# Patient Record
Sex: Male | Born: 2005 | Race: Black or African American | Hispanic: No | Marital: Single | Smoking: Current some day smoker
Health system: Southern US, Community
[De-identification: ages and names within clinical notes are randomized; demographics above are authoritative.]

## PROBLEM LIST (undated history)

## (undated) DIAGNOSIS — E119 Type 2 diabetes mellitus without complications: Secondary | ICD-10-CM

## (undated) HISTORY — PX: TONSILLECTOMY: SUR1361

## (undated) HISTORY — PX: ADENOIDECTOMY: SUR15

---
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
## 2024-01-05 ENCOUNTER — Encounter (INDEPENDENT_AMBULATORY_CARE_PROVIDER_SITE_OTHER): Payer: Self-pay

## 2024-01-31 ENCOUNTER — Encounter (INDEPENDENT_AMBULATORY_CARE_PROVIDER_SITE_OTHER): Payer: Self-pay

## 2024-02-06 ENCOUNTER — Other Ambulatory Visit (INDEPENDENT_AMBULATORY_CARE_PROVIDER_SITE_OTHER): Payer: Self-pay | Admitting: Family

## 2024-02-06 DIAGNOSIS — E1065 Type 1 diabetes mellitus with hyperglycemia: Secondary | ICD-10-CM

## 2024-02-06 DIAGNOSIS — R739 Hyperglycemia, unspecified: Secondary | ICD-10-CM

## 2024-02-19 ENCOUNTER — Ambulatory Visit (INDEPENDENT_AMBULATORY_CARE_PROVIDER_SITE_OTHER): Payer: Self-pay | Admitting: Family

## 2024-02-19 NOTE — Progress Notes (Deleted)
 Pediatric Endocrinology Diabetes Consultation Follow-up Visit  Jeffrey Bowers 2006/01/16 [id number]  Chief Complaint: Follow-up type 1 diabetes   Roxane Copp, MD   HPI: Jeffrey Bowers  is a 18 y.o. male presenting for follow-up of type 1 diabetes. he is accompanied to this visit by his mother.  1. Jeffrey Bowers was taken to [facility] ED on 07/17/2017 with polyuria, polydispsia and weight loss. He had been vomiting that day and could not keep down fluids, he also had labored breathing. His CBG was >600 and BHOB was >8.0, he was admitted to the PICU. Once moved to the floor he was started on Lantus  long acting insulin  and Novolog  short acting. He was provided diabetes education before being discharged home on 07/21/2017  2. Jeffrey Bowers was last seen in clinic on 11/2023, since that time he has been doing well.   He is busy with college at [employer], he is doing well living in the dorms. Starts a new job at [employer] soon. Gets activity skateboarding and walking around campus.   Sertraline dose was increased to 100 mg, he feels like medication has helped with anxiety and depression. He is also in therapy once monthly.   Reports that diabetes is going "better". He is wearing CGM more consistently since last visit and also is making sure to change insulin  in his pump before he runs out. He normally boluses before eating, rarely forgets to bolus. Carb intake averages between 50-75 grams per meal. Hypoglycemia does not occur often, only if " I over bolus". He is able to feel symptoms when he is under 80, get light headed and weak.   Concerns  - His insulin  pump is out of warranty. We are unable to download pump, software says it is out of warranty when we try to download.  -   Insulin  regimen: Tandem Tslim insulin  pump Basal Rates 12AM 1.30   4am 1.15   6am 1.15  8am 1.35  9pm 1.35     31.4 units per day   Insulin  to Carbohydrate Ratio 12AM 10   4am 15  6am 8am 8 6  9pm 6        Insulin  Sensitivity  Factor 12AM 50   8am 35  9pm 40            Target Blood Glucose 12AM 150  6am 120  9pm 150           Hypoglycemia: Able to feel low blood sugars.  No glucagon  needed recently. Rare  Insulin  pump and CGM level:  Unable to download pump information today.   Med-alert ID: Not currently wearing. Injection sites: abdomen, arms and legs  Annual labs due: 08/2023  Ophthalmology due: he has not been recently, is overdue. Discussed importance today of annual eye exams.     3. ROS: Greater than 10 systems reviewed with pertinent positives listed in HPI, otherwise neg. All systems reviewed with pertinent positives listed below; otherwise negative. Constitutional: Sleeping well HEENT: No vision changes. No difficulty swallowing.  Respiratory: No increased work of breathing currently GI: No constipation or diarrhea Musculoskeletal: No joint deformity Neuro: Normal affect Endocrine: As above Psych: denies depression and anxiety currently, taking sertraline 100 mg daily   Past Medical History:   Past Medical History:  Diagnosis Date   Diabetes mellitus without complication (HCC)     Medications:  Outpatient Encounter Medications as of 02/19/2024  Medication Sig   Accu-Chek Softclix Lancets lancets Use to check blood sugar 8 times a day  albuterol (PROVENTIL HFA;VENTOLIN HFA) 108 (90 BASE) MCG/ACT inhaler Inhale 2 puffs into the lungs every 6 (six) hours as needed for wheezing or shortness of breath. (Patient not taking: Reported on 12/09/2023)   Blood Glucose Monitoring Suppl (ACCU-CHEK GUIDE) w/Device KIT Use meter to check blood sugar 8 times a day (Patient not taking: Reported on 12/09/2023)   cetirizine (ZYRTEC) 10 MG tablet Take 10 mg by mouth daily as needed for allergies. (Patient not taking: Reported on 12/09/2023)   Continuous Glucose Sensor (DEXCOM G6 SENSOR) MISC USE AS DIRECTED CHANGE  SENSOR  EVERY  10  DAYS   Continuous Glucose Transmitter (DEXCOM G6 TRANSMITTER) MISC  USE AS DIRECTED AND  CHANGE  EVERY  90  DAYS   FLUoxetine (PROZAC) 10 MG capsule Take 10 mg by mouth daily. (Patient not taking: Reported on 12/09/2023)   fluticasone (FLONASE) 50 MCG/ACT nasal spray Place 2 sprays into both nostrils daily as needed for allergies or rhinitis. (Patient not taking: Reported on 12/09/2023)   Glucagon  (BAQSIMI  TWO PACK) 3 MG/DOSE POWD Place 1 kit into the nose as needed. (Patient not taking: Reported on 12/09/2023)   glucose blood (ACCU-CHEK GUIDE) test strip Test sugars 6 times daily   HYDROcodone -acetaminophen  (HYCET) 7.5-325 mg/15 ml solution Take 10 mLs by mouth 4 (four) times daily as needed for moderate pain. (Patient not taking: Reported on 12/09/2023)   insulin  aspart (NOVOLOG  FLEXPEN) 100 UNIT/ML FlexPen INJECT UP TO 50 UNITS INTO THE SKIN DAILY.   insulin  glargine (LANTUS  SOLOSTAR) 100 UNIT/ML Solostar Pen Inject Up to 50 units/day.   NOVOLOG  100 UNIT/ML injection INJECT 300 UNITS IN INSULIN  PUMP EVERY 48 HOURS   sertraline (ZOLOFT) 25 MG tablet Take 25 mg by mouth daily.   sucralfate  (CARAFATE ) 1 GM/10ML suspension Take 5 mLs (0.5 g total) by mouth 4 (four) times daily as needed. (Patient not taking: Reported on 12/09/2023)   No facility-administered encounter medications on file as of 02/19/2024.    Allergies: Allergies  Allergen Reactions   Chocolate     Throat issues    Surgical History: Past Surgical History:  Procedure Laterality Date   ADENOIDECTOMY     TONSILLECTOMY      Family History:  Family History  Problem Relation Age of Onset   Diabetes Maternal Grandmother    Diabetes Paternal Grandmother       Social History: Lives with: Mother, father and 3 siblings.   Physical Exam:  There were no vitals filed for this visit.      There were no vitals taken for this visit. Body mass index: body mass index is unknown because there is no height or weight on file. Blood pressure %iles are not available for patients who are 18 years or  older.  Ht Readings from Last 3 Encounters:  02/12/23 5' 9.8" (1.773 m) (57%, Z= 0.17)*  11/13/22 5' 9.49" (1.765 m) (53%, Z= 0.08)*  05/08/22 5' 9.49" (1.765 m) (56%, Z= 0.15)*   * Growth percentiles are based on CDC (Boys, 2-20 Years) data.   Wt Readings from Last 3 Encounters:  12/09/23 150 lb 12.8 oz (68.4 kg) (49%, Z= -0.02)*  08/18/23 152 lb 9.6 oz (69.2 kg) (54%, Z= 0.10)*  05/15/23 140 lb 9.6 oz (63.8 kg) (36%, Z= -0.36)*   * Growth percentiles are based on CDC (Boys, 2-20 Years) data.    Physical Exam.  General: Well developed, well nourished male in no acute distress.  Head: Normocephalic, atraumatic.   Eyes:  Pupils equal and round.  EOMI.  Sclera white.  No eye drainage.   Ears/Nose/Mouth/Throat: Nares patent, no nasal drainage.  Normal dentition, mucous membranes moist.  Neck: supple, no cervical lymphadenopathy, no thyromegaly Cardiovascular: regular rate, normal S1/S2, no murmurs Respiratory: No increased work of breathing.  Lungs clear to auscultation bilaterally.  No wheezes. Abdomen: soft, nontender, nondistended. Normal bowel sounds.  No appreciable masses  Extremities: warm, well perfused, cap refill < 2 sec.   Musculoskeletal: Normal muscle mass.  Normal strength Skin: warm, dry.  No rash or lesions. Neurologic: alert and oriented, normal speech, no tremor   Labs:  Results for orders placed or performed in visit on 12/09/23  POCT Glucose (Device for Home Use)   Collection Time: 12/09/23  1:23 PM  Result Value Ref Range   Glucose Fasting, POC     POC Glucose 160 (A) 70 - 99 mg/dl  POCT glycosylated hemoglobin (Hb A1C)   Collection Time: 12/09/23  1:27 PM  Result Value Ref Range   Hemoglobin A1C 7.8 (A) 4.0 - 5.6 %   HbA1c POC (<> result, manual entry)     HbA1c, POC (prediabetic range)     HbA1c, POC (controlled diabetic range)         Assessment/Plan: Jeffrey Bowers is a 18 y.o. male with  type 1 diabetes on Tandem insulin  pump and Dexcom CGM. Unable  to download and review insulin  pump today due to out of warranty, Jeffrey Bowers is in process of ordering new pump. Hemoglobin A1c has improved to 7.8% but is higher then ADA goal of <7%.    1. Type 1 diabetes mellitus in pediatric patient (HCC)/ 2. Hyperglycemia/ - Reviewed insulin  pump and CGM download. Discussed trends and patterns.  - Rotate pump sites to prevent scar tissue.  - bolus 15 minutes prior to eating to limit blood sugar spikes.  - Reviewed carb counting and importance of accurate carb counting.  - Discussed signs and symptoms of hypoglycemia. Always have glucose available.  - POCT glucose and hemoglobin A1c  - Reviewed growth chart.    3. Insulin  pump titration  - Insulin  pump in place.   Follow-up:   3 month.   LOS:  37 minutes  spent today reviewing the medical chart, counseling the patient/family, and documenting today's visit. This time does not include CGM interpretation.   When a patient is on insulin , intensive monitoring of blood glucose levels is necessary to avoid hyperglycemia and hypoglycemia. Severe hyperglycemia/hypoglycemia can lead to hospital admissions and be life threatening.    Candee Cha, DNP, FNP-C  Pediatric Specialist  [address]  [place], [address]  Tele: [phone]

## 2024-03-03 ENCOUNTER — Ambulatory Visit (INDEPENDENT_AMBULATORY_CARE_PROVIDER_SITE_OTHER): Payer: Self-pay | Admitting: Family

## 2024-03-29 ENCOUNTER — Other Ambulatory Visit (HOSPITAL_COMMUNITY): Payer: Self-pay

## 2024-03-29 ENCOUNTER — Telehealth (INDEPENDENT_AMBULATORY_CARE_PROVIDER_SITE_OTHER): Payer: Self-pay | Admitting: Pharmacy Technician

## 2024-03-29 NOTE — Telephone Encounter (Signed)
 Pharmacy Patient Advocate Encounter   Received notification from Onbase that prior authorization for Dexcom G6 Sensor is required/requested.   Insurance verification completed.   The patient is insured through [facility] MEDICAID .   Key: [id number]  Prior Authorization form/request asks a question that requires your assistance. Please see the question below and advise accordingly. The PA will not be submitted until the necessary information is received.   **Patient needs appointment.**

## 2024-03-31 NOTE — Telephone Encounter (Signed)
 Mom called and spoke with me to begin with, Mom wasn't happy about not  being able to get his dexcoms. I asked kelly for assistance.

## 2024-03-31 NOTE — Telephone Encounter (Addendum)
 Spoke with mom, Patient last seen on 11/2023, adult endo referral sent 01/2024, adult endo appt is in Nov 2025.  Explained to mom that insurance requires that he been seen in the last 3 mo to get his Dexcom authorized.  She was not happy and said that she was going to sue everyone she has spoken with this as it is not acceptable.  I spoke with Dr. Arlana who has an opening 04/28/2024 at 10:45 am he is ok with seeing him.  I told mom that he will need to do finger sticks until that appt and enter them into his pump in manuel mode If using a pump.  Mom said that is not acceptable.  I explained that we do not have anything sooner.  She is welcome to call other adult endo's to see if they can get him in sooner and we can send another referral.  I again stated that he will need to do finger sticks, she was still upset and stated this is unacceptable.  Explained that insurance has their guidelines and they do not make exceptions.  We have no control or say so in insurance guidelines. She insisted that we give her a Dexcom G6 sample, I explained that we do not have those samples.  I apologized to mom that he was not seen before spenser left.  I also mentioned that he was scheduled on 6/5 and no showed. She then hung up.    Had front office schedule him with Dr. Arlana.

## 2024-04-28 ENCOUNTER — Ambulatory Visit (INDEPENDENT_AMBULATORY_CARE_PROVIDER_SITE_OTHER)

## 2024-04-28 ENCOUNTER — Encounter (INDEPENDENT_AMBULATORY_CARE_PROVIDER_SITE_OTHER): Payer: Self-pay

## 2024-04-28 VITALS — BP 110/70 | HR 80 | Ht 70.08 in | Wt 147.0 lb

## 2024-04-28 DIAGNOSIS — Z9641 Presence of insulin pump (external) (internal): Secondary | ICD-10-CM | POA: Diagnosis not present

## 2024-04-28 DIAGNOSIS — E1065 Type 1 diabetes mellitus with hyperglycemia: Secondary | ICD-10-CM | POA: Diagnosis not present

## 2024-04-28 DIAGNOSIS — R739 Hyperglycemia, unspecified: Secondary | ICD-10-CM

## 2024-04-28 LAB — POCT GLYCOSYLATED HEMOGLOBIN (HGB A1C): Hemoglobin A1C: 8.5 % — AB (ref 4.0–5.6)

## 2024-04-28 LAB — POCT GLUCOSE (DEVICE FOR HOME USE): Glucose Fasting, POC: 310 mg/dL — AB (ref 70–99)

## 2024-04-28 MED ORDER — NOVOLOG 100 UNIT/ML IJ SOLN: INTRAMUSCULAR | 2 refills | Status: AC

## 2024-04-28 MED ORDER — DEXCOM G6 TRANSMITTER MISC: 1 refills | Status: AC

## 2024-04-28 MED ORDER — DEXCOM G6 SENSOR MISC: 5 refills | Status: DC

## 2024-04-28 MED ORDER — BAQSIMI TWO PACK 3 MG/DOSE NA POWD: 1.0000 | NASAL | 1 refills | Status: AC | PRN

## 2024-04-28 MED ORDER — NOVOLOG FLEXPEN 100 UNIT/ML ~~LOC~~ SOPN: PEN_INJECTOR | SUBCUTANEOUS | 5 refills | Status: AC

## 2024-04-28 MED ORDER — LANTUS SOLOSTAR 100 UNIT/ML ~~LOC~~ SOPN: PEN_INJECTOR | SUBCUTANEOUS | 5 refills | Status: AC

## 2024-04-28 NOTE — Progress Notes (Signed)
 Pediatric Diabetes Follow Up Note  PATIENT:  Jeffrey Bowers Date of Examination: 04/28/2024 Date of Birth:  [birth year]   Referring Physician(s): Dale Fell, MD (but apparently will be establishing care with an adult provider)  Accompanied by:  Self  Dominica Kent reviewed my role as a temporary/substitute/pinch-hitting ([place] [facility]) Pediatric Endocrinologist.    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 at 5,874.6 U/mL (ref < 5) Presented in moderate-to-severe DKA   Last Visit:    12/09/23 with HbA1c :  7.8%    HbA1c Trend: DATE HbA1c (%) 8.5 INSULIN  REGIMEN/COMMENT  07/17/17 10.8 / 10.9 At [place] Begin basal-bolus by MDI after DKA resolved  11/11/17 5.9 MDI  02/03/18 6.6 MDI  05/07/18 7.2 MDI  08/18/18 8.5 Begin CSII  11/25/18 7.6 CSII  03/04/19 7.1 CSII  06/07/19 8.7 CSII  09/06/19 7.4 CSII  04/11/20 7.5 CSII  07/12/20 6.7 CSII  10/12/20 7.8 CSII  02/08/21 7.5 CSII  05/16/21 6.8 CSII  08/15/21 8.0 CSII  02/07/22 10.4 COVID CSII  05/08/22 10.7 CSII  11/13/22 9.4 CSII  02/12/23 8.1 CSII  05/15/23 7.5 CSII  08/18/23 8.4 CSII  12/09/23 7.8 CSII  04/28/24 8.5 CSII                  MDI = basal bolus by Multiple Daily Injections CSII = basal bolus by Continuous Subcutaneous Insulin  Infusion ("insulin  pump")  Insulin  Regimen:   Continuous subcutaneous insulin  infusion via a Tandem t:slim insulin  pump with the Control IQ algorithm currently delivering insulin  aspart (NovoLog ) at the following settings: BASAL   MN to 4 AM = 1.30 U/hr 4 AM to 6 AM = 1.15 U/hr 6 AM to 8 AM = 1.15 U/hr 8 AM to 9 AM 1.35 U/hr 9 AM to MN = 1.35 U/hr   .  BOLUS:  Meals and Snacks:  MN to 4 AM  = 1 unit  for every 10 grams of carbs 4 AM to 6 AM = 1 unit for every 15 grams of carbs 6 AM to 8 AM = 1 unit for every 8 grams of carbs 8 AM to 9 AM = 1 unit for every 6 grams of carbs 9 AM to MN = 1 unit for every 6 grams of carbs Correction Formula: Target Glucose = 110  mg/dL, as per Control IQ Insulin  Sensitivity Factor: MN to 4 AM = 50 4 AM to 6 AM = 45 6 AM to 8 AM = 45 8 AM to 9 AM = 35 9 AM to MN = 40  Active Insulin  Time = 5 hrs, per Control IQ  Uri Turnbough have a sneaking suspicion that some of the AM timeframes above were entered incorrectly and were supposed to have been PM.  In addition, he is using the Dexcom G6 continuous glucose monitor (CGM).    Additional Medications:    He reportedly has some basal insulin  glargine (Lantus ) at his mother's as backup long-acting insulin  in case of pump failure.  He needs some when he states that other places, including his girlfriends, Jamar Casagrande refilled that today. He did not believe he had any more glucagon  to treat serious lows.  He recalled that in the past he had a glucagon  kit but more recently had had the intranasal Baqsimi  glucagon .  Thanh Mottern refilled that today as well. He asked for refills of his CGM sensors etc.  Outpatient Encounter Medications as of 04/28/2024  Medication Sig   Accu-Chek Softclix Lancets lancets Use to check  blood sugar 8 times a day   Blood Glucose Monitoring Suppl (ACCU-CHEK GUIDE) w/Device KIT Use meter to check blood sugar 8 times a day   glucose blood (ACCU-CHEK GUIDE) test strip Test sugars 6 times daily   [DISCONTINUED] Continuous Glucose Sensor (DEXCOM G6 SENSOR) MISC USE AS DIRECTED CHANGE  SENSOR  EVERY  10  DAYS   [DISCONTINUED] Continuous Glucose Transmitter (DEXCOM G6 TRANSMITTER) MISC USE AS DIRECTED AND  CHANGE  EVERY  90  DAYS   [DISCONTINUED] FLUoxetine (PROZAC) 10 MG capsule Take 10 mg by mouth daily.   [DISCONTINUED] Glucagon  (BAQSIMI  TWO PACK) 3 MG/DOSE POWD Place 1 kit into the nose as needed.   [DISCONTINUED] HYDROcodone -acetaminophen  (HYCET) 7.5-325 mg/15 ml solution Take 10 mLs by mouth 4 (four) times daily as needed for moderate pain.   [DISCONTINUED] insulin  aspart (NOVOLOG  FLEXPEN) 100 UNIT/ML FlexPen INJECT UP TO 50 UNITS INTO THE SKIN DAILY.   [DISCONTINUED]  insulin  glargine (LANTUS  SOLOSTAR) 100 UNIT/ML Solostar Pen Inject Up to 50 units/day.   [DISCONTINUED] NOVOLOG  100 UNIT/ML injection INJECT 300 UNITS IN INSULIN  PUMP EVERY 48 HOURS   albuterol (PROVENTIL HFA;VENTOLIN HFA) 108 (90 BASE) MCG/ACT inhaler Inhale 2 puffs into the lungs every 6 (six) hours as needed for wheezing or shortness of breath. (Patient not taking: Reported on 04/28/2024)   cetirizine (ZYRTEC) 10 MG tablet Take 10 mg by mouth daily as needed for allergies. (Patient not taking: Reported on 04/28/2024)   Continuous Glucose Sensor (DEXCOM G6 SENSOR) MISC USE AS DIRECTED CHANGE  SENSOR  EVERY  10  DAYS   Continuous Glucose Transmitter (DEXCOM G6 TRANSMITTER) MISC USE AS DIRECTED AND  CHANGE  EVERY  90  DAYS   fluticasone (FLONASE) 50 MCG/ACT nasal spray Place 2 sprays into both nostrils daily as needed for allergies or rhinitis. (Patient not taking: Reported on 04/28/2024)   Glucagon  (BAQSIMI  TWO PACK) 3 MG/DOSE POWD Place 1 kit into the nose as needed.   insulin  aspart (NOVOLOG  FLEXPEN) 100 UNIT/ML FlexPen INJECT UP TO 50 UNITS INTO THE SKIN DAILY.   insulin  glargine (LANTUS  SOLOSTAR) 100 UNIT/ML Solostar Pen Inject Up to 50 units/day.   NOVOLOG  100 UNIT/ML injection INJECT 300 UNITS IN INSULIN  PUMP EVERY 48 HOURS   sertraline (ZOLOFT) 100 MG tablet Take 100 mg by mouth daily.   sucralfate  (CARAFATE ) 1 GM/10ML suspension Take 5 mLs (0.5 g total) by mouth 4 (four) times daily as needed. (Patient not taking: Reported on 04/28/2024)   No facility-administered encounter medications on file as of 04/28/2024.     Knew to check for ketones if BS >240 mg/dL: No; use 699; with the hands, he recalled to check for ketones with illness especially vomiting   Wearing medical ID: Edword Cu failed to record  Meal Plan:   Carbohydrate counting  Physical Activity:   Some walking, some rollerblading   INTERVAL HISTORY:  Jeffrey Bowers is now a 19-1/19 year old young adult man of African-American heritage who  returns today for follow-up care recognizing that today was likely his last scheduled visit in Pediatric Endocrinology as he has graduated.  However he needed refills of supplies and some preauthorization's and we agreed to see him.  To review briefly: Tycen was first made aware to [facility] during his hospitalization here from November 1 through 6, 2018 when he presented in DKA.  He was seen by Dr. Ozell Crouch, now formerly of this clinic.  He had had a several week history of polyuria, nocturia, new onset enuresis,  and polydipsia.  On the morning of admission he had 3 episodes of emesis and he developed some breathing difficulty.  There had been a 13 pound weight loss over the previous months.  He was in moderate to severe DKA with serum glucose of 759 mg/dL and serum CO2 of 11.  There was large glycosuria and ketonuria.  There was evidence of acute renal injury with creatinine of 1.8 mg/dL.  He was treated in our PICU with intravenous insulin  and IV fluids and his metabolic derangements gradually improved.  The hemoglobin A1c was elevated at 10.8% / 10.9%.  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  He and the family underwent diabetes education and he was initially treated with an intensive basal-bolus insulin  plan by multiple daily injections (MDI).  He entered his diabetes honeymoon as can be seen in the table above.  After about a year of MDI, he was switched to continuous subcutaneous insulin  infusion (CSII) with an insulin  pump.  Rankin has been followed regularly and overall has done very well from a glycemic perspective.  Much of his follow-up had been with Mr. Jeannene Penton, FNP, now also formerly of this clinic.  Kaydenn was last seen on December 09, 2023.  In the interval since last seen Mancel generally has been well.  He denied serious illness, accident, or trauma.   He did recall 2 episodes of upper respiratory tract infection in the interval.  He recognized some increased urination (polyuria) but denied increased thirst (polydipsia) or nighttime urination (nocturia) or bedwetting (nocturnal enuresis) or chronic/recurring fungal infections (eg: athlete's foot, thrush, or jock itch in boys or vaginal yeast infections in girls).  Maikel Neisler elicited no other constitutional symptoms relative to energy levels, sleep patterns, appetite, bathroom/bowel habits, ambient temperature intolerances, headaches, back/leg pains, vision issues, etc.    He did infer that he has had a bit of stress in his life of late but he did not go into detail.  He did indicate that he receives antianxiety medications (sertraline) as noted above.  The impression was that the stress was somehow related to his college studies and/or his girlfriend.  Jamonica Schoff understood that he is a rising sophomore at [employer] where Virgene Tirone understood he is studying Production assistant, radio.  He is currently also working at an [employer].  He is sexually active but uses prophylactics.  He denied use of tobacco and ethanol; he indicated occasional use of marijuana.  He could not recall when he last underwent formal eye care assessment by an eye care for functional but believes it has been more than 1 year and he hopes to schedule a visit soon.  He does not drive.   PHYSICAL EXAMINATION:  BP 110/70 (BP Location: Left Arm, Patient Position: Sitting, Cuff Size: Small)   Pulse 80   Ht 5' 10.08 (1.78 m)   Wt 147 lb (66.7 kg)   BMI 21.04 kg/m    DATE 05/15/23 08/18/23 12/09/23 04/28/24 AVG for HEIGHT AVG for AGE  HEIGHT, cm ND ND ND 178.0 HA = adult   HT SDS    +0.19    WEIGHT, kg 63.8 69.2 68.4 66.7    WT SDS -0.36 +0.10 -0.02 -0.25    ARM SPAN, cm        LWR, cm        UPR/LWR        HEAD CIRC, cm  BMI, kg/m2    21.0    BMI SDS    -0.55    BSA, m2                  In general, Mekiah was a very pleasant and likable young man whose care was stylishly braided.  He was certainly cooperative.  He had the interesting habit of echoing back the questions Kaylyn Garrow asked of him; was this a nervous response?  He seemingly was in no acute distress. The skin was supple without significant blemishes; there was no evidence of lipodystrophy at injection/insertion sites.  The nose was pierced.  The pupils were equal and responsive to light and accommodation; the extraocular movements were intact; the funduscopic exam was normal; visual fields were grossly full. The rest of the head, ears, eyes, nose and throat examination was normal although the auditory canals were occluded by cerumen and the posterior pharynx was slightly reddened but he denied sore throat..  There were 28 teeth with all 12-year molars erupted.  The thyroid was not palpably enlarged and there were no nodules appreciated.   There was no worrisome cervical or supraclavicular lymphadenopathy.  The cardiac examination revealed normal S1 and S2 without murmur appreciated and the lungs were clear to auscultation.  The abdomen had positive bowel sounds and was soft without hepatosplenomegaly or masses appreciated.  The extremity and neurologic examinations were without focal or lateralizing signs.  The Achilles tendon relaxation phase was normal.  There was no tremor to the outstretched arms and there were no tongue fasciculations.   There was no clinical scoliosis appreciated.  SEXUAL EXAMINATION:   This portion of the examination was deferred.    Review of the growth charts demonstrate the following:  Growth Parameters: HEIGHT:    WEIGHT:   BMI:    LABORATORY: Chee last underwent annual screening for diabetes complications and comorbidities in December 2024.  Available data are as follows; Rage Beever thought any results flagged by the lab were clinically insignificant, unless Rafal Archuleta comment further:  :  Latest Reference  Range & Units 08/18/23 11:31  Sodium 135 - 146 mmol/L 143  Potassium 3.8 - 5.1 mmol/L 4.4  Chloride 98 - 110 mmol/L 105  CO2 20 - 32 mmol/L 31  Glucose 65 - 139 mg/dL 872  BUN 7 - 20 mg/dL 13  Creatinine 9.39 - 8.75 mg/dL 8.97  Calcium 8.9 - 89.5 mg/dL 9.0  eGFR > OR = 60 fO/fpw/8.26f7 109  AG Ratio 1.0 - 2.5 (calc) 1.9  AST 12 - 32 U/L 19  ALT 8 - 46 U/L 13  Total Protein 6.3 - 8.2 g/dL 6.6  Total Bilirubin 0.2 - 1.1 mg/dL 1.6 (H)  Total CHOL/HDL Ratio <5.0 (calc) 1.9  Cholesterol <170 mg/dL 869  HDL Cholesterol >54 mg/dL 68  LDL Cholesterol (Calc) <110 mg/dL (calc) 49  Non-HDL Cholesterol (Calc) <120 mg/dL (calc) 62  Triglycerides <90 mg/dL 53  Alkaline phosphatase (APISO) 46 - 169 U/L 66  Globulin 2.1 - 3.5 g/dL (calc) 2.3  TSH 9.49 - 4.30 mIU/L 1.06  T4,Free(Direct) 0.8 - 1.4 ng/dL 1.0  Albumin MSPROF 3.6 - 5.1 g/dL 4.3  (H): Data is abnormally high  Note that he did not undergo screening for celiac disease and thyroid antibodies were not measured, and there is no urine for albumin/creatinine.  Today, the HbA1c was a little further elevated at 8.5%.  On his Dexcom G6 CGM: From April 01, 2024 to April 28, 2024: AVG glucose = 258 mg/dL +/-  110 CGM only in use 19% of the time In target 33% of the time Goal generally is > 70% Below 70 mg/dL 9.5% of the time Below 55 mg/dL 0% of the time Above 749 mg/dL 50% of the time  LOW Alert = 80 mg/dL Low Repeat = OFF  Mattalynn Crandle advised to turn this ON and set the increment to 15 minutes HIGH Alert = 300 mg/dL      -Gianni Fuchs advised to set this to 250 mg/dL High Repeat = OFF  Rosa Wyly advised to turn this ON and set the increment to 120 minutes Fall Rate =OFF  Olvin Rohr advised to turn this ON Rise Rate = OFF Ronne Savoia do not mind if this is 'Off' at this juncture. Urgent Low = 55 mg/dL Signal Loss = ON Increment set to 20 minutes   IMPRESSION:   1. Type 1 diabetes mellitus in fair glycemic control with hyperglycemia despite being on an intensive,  basal-bolus insulin plan with continuous subcutaneous insulin infusion (CSII). 2. History of stress 3.   4.   COMMENTS/MEDICAL DECISION MAKING:   The pump settings are irregular and Saarah Dewing fear hourly to every 2 hour changes was done in error.  Dakhari Zuver made adjustments today (see below).  We reset pump settings together (below) and reset the Dexcom alert settings as above.  He is now a young adult and should graduate Pediatric Endocrinology.  Demba Nigh understand that a referral to adult endocrinology has already been placed.  PLANS AND RECOMMENDATIONS: 1. The following insulin adjustments were made: BASAL   MN to 6 AM = 1.45 U/hr 6 AM to noon = 1.40 U/hr Noon to 10 PM = 1.50 U/hr 10 PM to MN = 1.35 U/hr   .  BOLUS:  Meals and Snacks:  MN to 6 AM  = 1 unit  for every 10 grams of carbs 6 AM to noon = 1 unit for every 8 grams of carbs Noon to 10 AM = 1 unit for every 6 grams of carbs 10 PM to MN = 1 unit for every 6 grams of carbs Correction Formula: Target Glucose = 110 mg/dL, as per Control IQ Insulin Sensitivity Factor: MN to 6 AM = 50 6 AM to noon = 45 Noon to 10 PM = 35 10 PM to MN = 40  Active Insulin Time = 5 hrs, per Control IQ    2. The diagnosis and medication effects were reviewed as is the importance of medical identification and that urine ketones should be checked when blood glucose is more than 240 mg/dL (especially 2 checks 4 hours apart) and any illness especially with vomiting, even if glucose is normal.  3. Components of a healthy lifestyle were reviewed including proper diet and exercise. 4. When a patient is on insulin, intensive monitoring of blood glucose is important to avoid hyperglycemia and hypoglycemia.  Severe hyperglycemia can lead to ketosis and DKA which often requires ICU admission and intravenous insulin.  Severe hypoglycemia can lead potentially lead to seizures/convulsions.   5. Patients and families are again advised that Glucagon is typically reserved to  bring up low glucose levels associated with loss of consciousness or convulsions.  6. The patient/family are reminded of the "Rule of 15:" Give 15 grams of carbohydrates to treat a low then recheck in 15 minutes; repeat as necessary.   7. Injection/insertion sites should be rotated. 8. The nature of the HbA1c was discussed/reviewed. 9. Annual screening for diabetes complications and co-morbidities will be due November 2025 and  Stanislav Gervase advise to include:  Free T4, TSH, antiperoxidase and antithyroglobulin thyroid antibodies; tissue transglutaminase IgA antibody screening along with validating total IgA to screen for celiac disease; lipid profile; 25-hydroxy Vitamin D; and urinary microalbumin/creatinine ratio.  Furthermore, the patient is due for annual formal, dilated ophthalmologic exam to assess for diabetes-related eye changes.   10. Follow-up should be with adult care but this office will be available for the next 90 days for emergencies.  When adult care is established he will be formally graduated from Pediatric Endocrinology.  If he were to be admitted to the hospital within that timeframe, however, he would be followed by the adult care.   11.    12.  13.   Face-to-Face: Time In 11:34 AM; Time Out 12:11 PM in addition, Tal Kempker spent 11 minutes in pre-clinic chart review > 50% of the clinical assessment was spent in counseling/care coordination.   CHANETA Alm Casey, MD Pediatric Endocrinologist ([facility] ten[facility])  Cc: Dale Fell, MD  This document was created, in part, with the use of voice recognition/dictation software. A conscious effort has been made to improve accuracy of this document. Any obvious errors or omissions should be clarified with the author.

## 2024-05-03 ENCOUNTER — Telehealth (INDEPENDENT_AMBULATORY_CARE_PROVIDER_SITE_OTHER): Payer: Self-pay | Admitting: Pharmacy Technician

## 2024-05-03 ENCOUNTER — Telehealth (INDEPENDENT_AMBULATORY_CARE_PROVIDER_SITE_OTHER): Payer: Self-pay

## 2024-05-03 ENCOUNTER — Other Ambulatory Visit (HOSPITAL_COMMUNITY): Payer: Self-pay

## 2024-05-03 NOTE — Telephone Encounter (Signed)
 Pharmacy Patient Advocate Encounter   Received notification from Pt Calls Messages that prior authorization for Dexcom G6 Sensors and Transmitter  is required/requested.   Insurance verification completed.   The patient is insured through [facility] MEDICAID .   Per test claim: PA required; PA started via CoverMyMeds. Waiting for clinical questions to populate.

## 2024-05-03 NOTE — Telephone Encounter (Signed)
 Thank you!  PA's have been sent for both Dexcom G6 Sensors and Transmitter. I am waiting for the clinical questions to populate.

## 2024-05-03 NOTE — Telephone Encounter (Signed)
 Sorry I thought they were moving to adult endo. Otherwise I would have made me a follow up for. Will start PA now.

## 2024-05-03 NOTE — Telephone Encounter (Signed)
 Who's calling (name and relationship to patient) : [name]; mom   Best contact number: [phone]  Provider they see:  Silverio, MD   Reason for call: Mom called in stating that Deniel was previously seen.She stated  that he is needing a prior  Auth.  He is needing the Dexcom G6, mom stated that he has been out of his sensors for 2 months. Mom is requesting a call back. She also mentioned that she was told an appt was needed in order to get PA.     Call ID:      PRESCRIPTION REFILL ONLY  Name of prescription:  Pharmacy:

## 2024-05-03 NOTE — Telephone Encounter (Signed)
 Clinical questions have been answered and PA submitted. PA currently Pending. Please be advised that most companies allow up to 30 days to make a decision. We will advise when a determination has been made, or follow up in 1 week.   Please reach out to our team, Rx Prior Auth Pool, if you haven't heard back in a week.

## 2024-05-03 NOTE — Telephone Encounter (Signed)
 Called mom and let her know the PA will be started today. Mom stated the PA was supposed to be started last week and that was the point f the appointment. I let mom know The Pa will be started today and we should find out Tomorrow or today. If we do nit hear back then it will be Wednesday, mom caught an attitude due to Pa not started yet. I let mom know we will start it and find out in a couple days.

## 2024-05-05 ENCOUNTER — Other Ambulatory Visit (HOSPITAL_COMMUNITY): Payer: Self-pay

## 2024-05-05 NOTE — Telephone Encounter (Signed)
 Pharmacy Patient Advocate Encounter  Received notification from [facility] MEDICAID that Prior Authorization for Dexcom G6 Sensors and Transmitter has been APPROVED from 05/03/24 to 05/03/25. Unable to obtain price due to refill too soon rejection, last fill date 05/05/24 next available fill date 05/28/24   PA #/Case ID/Reference #: [id number]

## 2024-07-27 ENCOUNTER — Encounter: Payer: Self-pay | Admitting: Endocrinology

## 2024-07-27 ENCOUNTER — Other Ambulatory Visit

## 2024-07-27 ENCOUNTER — Ambulatory Visit (INDEPENDENT_AMBULATORY_CARE_PROVIDER_SITE_OTHER): Admitting: Endocrinology

## 2024-07-27 ENCOUNTER — Ambulatory Visit: Payer: Self-pay | Admitting: Endocrinology

## 2024-07-27 VITALS — BP 118/70 | HR 79 | Ht 69.0 in | Wt 144.4 lb

## 2024-07-27 DIAGNOSIS — E108 Type 1 diabetes mellitus with unspecified complications: Secondary | ICD-10-CM

## 2024-07-27 DIAGNOSIS — E1065 Type 1 diabetes mellitus with hyperglycemia: Secondary | ICD-10-CM

## 2024-07-27 LAB — POCT GLYCOSYLATED HEMOGLOBIN (HGB A1C): Hemoglobin A1C: 8.2 % — AB (ref 4.0–5.6)

## 2024-07-27 MED ORDER — DEXCOM G7 SENSOR MISC: 1.0000 | 3 refills | Status: AC

## 2024-07-27 NOTE — Progress Notes (Signed)
 Outpatient Endocrinology Note Doyle Tegethoff, MD   Patient's Name: Jeffrey Bowers    DOB: Oct 09, 2005    MRN: [id number]                                                    REASON OF VISIT: New consult for type 1 diabetes mellitus  REFERRING PROVIDER: Wonda Redbird, MD  PCP: Wonda Redbird, MD  HISTORY OF PRESENT ILLNESS:   Jeffrey Bowers is a 18 y.o. old male with past medical history listed below, is here for new consult for type 1 diabetes mellitus.   Pertinent Diabetes History: Patient is transitioning from pediatric to adult endocrinology for type 1 diabetes mellitus, initial visit on July 27, 2024.  Patient was diagnosed with type 1 diabetes mellitus in November 2018.  At the time of diagnosis hospitalized with diabetes ketoacidosis, had significantly elevated GAD 65 antibodies.  He was initially managed with multidose insulin  regimen and started on insulin  pump therapy in December 2019.  He has uncontrolled type 1 diabetes mellitus mostly hemoglobin A1c in the range of 7 to 8%.  History of DKA or diabetes related hospitalizations: At the time of diagnosis.  Previous diabetes education: Yes   Family h/o diabetes mellitus: grand mother with type 2 DM.     Latest Reference Range & Units 07/17/17 12:02  Insulin  Antibodies, Human uU/mL <5.0  Glutamic Acid Decarb Ab 0.0 - 5.0 U/mL 5,874.6 (H)  Pancreatic Islet Cell Antibody Neg:<1:1  Negative  C-Peptide 1.1 - 4.4 ng/mL 0.5 (L)  (H): Data is abnormally high (L): Data is abnormally low  Chronic Diabetes Complications : Retinopathy: no. Last ophthalmology exam was done on Due, following with ophthalmology regularly.  Nephropathy: no Peripheral neuropathy: no Coronary artery disease: no Stroke: no  Relevant comorbidities and cardiovascular risk factors: Obesity: no Body mass index is 21.32 kg/m.  Hypertension: no Hyperlipidemia : no  Current / Home Diabetic regimen includes:  Tandem t:slim insulin  pump control IQ using  NovoLog  U100.  With Dexcom G7.  Sometimes uses G6 as well.  Insulin  Pump setting:  Basal MN- 1.450u/hour 6AM- 1.40  12PM- 1.50 10PM- 1.350  Bolus CHO Ratio (1unit:CHO) MN- 1:10 6AM- 1:8 12PM- 1:6 10PM- 1:6  Correction/Sensitivity: MN- 1:50 6AM- 1:45 12PM- 1:35 10PM- 1:40  Target: 110   Active insulin  time: 5 hours  Prior diabetic medications: Multidose insulin  regimen.  CONTINUOUS GLUCOSE MONITORING SYSTEM (CGMS) / INSULIN  PUMP INTERPRETATION:                         Tandem Pump & Sensor Download Dexcom G7 (Reviewed and summarized below.) Dates: October 15 to July 27, 2024    Average daily carbs entered: 26g Average total daily insulin :  44 units, Basal: 70%, Bolus: 30%,  (Manual Bolus: 12%,  Control IQ Bolus: 88%)   Control IQ Time in Use: 88%   Trends:  Mostly postprandial hyperglycemia with blood sugar 200-300 range, sometimes up to 400 range, hyperglycemia mostly in the late evening and overnight related to high carb meal and mostly no meal boluses.  Mild hyperglycemia during daytime improved with auto correctional boluses.  Blood sugar in between the meals and most of the daytime are acceptable.  No concerning hypoglycemia.  Hypoglycemia: Patient has minor hypoglycemic episodes. Patient has hypoglycemia awareness, has a glucagon  ER kit  and diabetic alert device.     Factors modifying glucose control: 1.  Diabetic diet assessment: 1-3 meals a day, usually large late evening meals.  2.  Staying active or exercising:   3.  Medication compliance: compliant most of the time.  Interval history  Initial visit today, presented to establish adult endocrinology for known type 1 diabetes mellitus.  Pump and CGM data as reviewed above.  He ran out of the Dexcom G7 sensor has not used for couple of days.  Mostly postprandial hyperglycemia due to not having manual meal boluses.  He denies vision problem.  No numbness and ting of the feet.  No other complaints  today.  REVIEW OF SYSTEMS As per history of present illness.   PAST MEDICAL HISTORY: Past Medical History:  Diagnosis Date   Diabetes mellitus without complication (HCC)     PAST SURGICAL HISTORY: Past Surgical History:  Procedure Laterality Date   ADENOIDECTOMY     TONSILLECTOMY      ALLERGIES: Allergies  Allergen Reactions   Chocolate     Throat issues    FAMILY HISTORY:  Family History  Problem Relation Age of Onset   Diabetes Maternal Grandmother    Diabetes Paternal Grandmother     SOCIAL HISTORY: Social History   Socioeconomic History   Marital status: Single    Spouse name: Not on file   Number of children: Not on file   Years of education: Not on file   Highest education level: Not on file  Occupational History   Not on file  Tobacco Use   Smoking status: Some Days    Types: Cigarettes    Passive exposure: Past   Smokeless tobacco: Never  Vaping Use   Vaping status: Never Used  Substance and Sexual Activity   Alcohol use: Never   Drug use: Never   Sexual activity: Yes  Other Topics Concern   Not on file  Social History Narrative   [place]  at [employer].          Enjoys games.    Lives at home with mom, 2 sisters and 1 brother.   Social Drivers of Corporate Investment Banker Strain: Not on file  Food Insecurity: Not on file  Transportation Needs: Not on file  Physical Activity: Not on file  Stress: Not on file  Social Connections: Not on file    MEDICATIONS:  Current Outpatient Medications  Medication Sig Dispense Refill   Continuous Glucose Sensor (DEXCOM G7 SENSOR) MISC 1 Device by Does not apply route continuous. 9 each 3   Accu-Chek Softclix Lancets lancets Use to check blood sugar 8 times a day 300 each 2   albuterol (PROVENTIL HFA;VENTOLIN HFA) 108 (90 BASE) MCG/ACT inhaler Inhale 2 puffs into the lungs every 6 (six) hours as needed for wheezing or shortness of breath. (Patient not taking: Reported on 07/27/2024)     Blood  Glucose Monitoring Suppl (ACCU-CHEK GUIDE) w/Device KIT Use meter to check blood sugar 8 times a day 1 kit 2   cetirizine (ZYRTEC) 10 MG tablet Take 10 mg by mouth daily as needed for allergies. (Patient not taking: Reported on 07/27/2024)     Continuous Glucose Transmitter (DEXCOM G6 TRANSMITTER) MISC USE AS DIRECTED AND  CHANGE  EVERY  90  DAYS 1 each 1   fluticasone (FLONASE) 50 MCG/ACT nasal spray Place 2 sprays into both nostrils daily as needed for allergies or rhinitis. (Patient not taking: Reported on 07/27/2024)  Glucagon  (BAQSIMI  TWO PACK) 3 MG/DOSE POWD Place 1 kit into the nose as needed. 2 each 1   glucose blood (ACCU-CHEK GUIDE) test strip Test sugars 6 times daily 200 each 5   insulin  aspart (NOVOLOG  FLEXPEN) 100 UNIT/ML FlexPen INJECT UP TO 50 UNITS INTO THE SKIN DAILY. 15 mL 5   insulin  glargine (LANTUS  SOLOSTAR) 100 UNIT/ML Solostar Pen Inject Up to 50 units/day. 15 mL 5   NOVOLOG  100 UNIT/ML injection INJECT 300 UNITS IN INSULIN  PUMP EVERY 48 HOURS 50 mL 2   sertraline (ZOLOFT) 100 MG tablet Take 100 mg by mouth daily.     sucralfate  (CARAFATE ) 1 GM/10ML suspension Take 5 mLs (0.5 g total) by mouth 4 (four) times daily as needed. (Patient not taking: Reported on 07/27/2024) 120 mL 0   No current facility-administered medications for this visit.    PHYSICAL EXAM: Vitals:   07/27/24 1312  BP: 118/70  Pulse: 79  SpO2: 99%  Weight: 144 lb 6.4 oz (65.5 kg)  Height: 5' 9 (1.753 m)   Body mass index is 21.32 kg/m.  Wt Readings from Last 3 Encounters:  07/27/24 144 lb 6.4 oz (65.5 kg) (34%, Z= -0.40)*  04/28/24 147 lb (66.7 kg) (40%, Z= -0.25)*  12/09/23 150 lb 12.8 oz (68.4 kg) (49%, Z= -0.02)*   * Growth percentiles are based on CDC (Boys, 2-20 Years) data.    General: Well developed, well nourished male in no apparent distress.  HEENT: AT/[place], no external lesions.  Eyes: Conjunctiva clear and no icterus. Neck: Neck supple  Lungs: Respirations not  labored Neurologic: Alert, oriented, normal speech Extremities / Skin: Dry.  Psychiatric: Does not appear depressed or anxious  Diabetic Foot Exam - Simple   No data filed     LABS Reviewed Lab Results  Component Value Date   HGBA1C 8.2 (A) 07/27/2024   HGBA1C 8.5 (A) 04/28/2024   HGBA1C 7.8 (A) 12/09/2023   No results found for: FRUCTOSAMINE Lab Results  Component Value Date   CHOL 130 08/18/2023   HDL 68 08/18/2023   LDLCALC 49 08/18/2023   TRIG 53 08/18/2023   CHOLHDL 1.9 08/18/2023   Lab Results  Component Value Date   MICRALBCREAT NOTE 05/08/2022   MICRALBCREAT 2 08/15/2021   Lab Results  Component Value Date   CREATININE 1.02 08/18/2023   No results found for: GFR  ASSESSMENT / PLAN  1. Type 1 diabetes mellitus with complications (HCC)   2. Uncontrolled type 1 diabetes mellitus with hyperglycemia (HCC)     Diabetes Mellitus type 1, complicated by no other known complications. - Diabetic status / severity: Uncontrolled.  Lab Results  Component Value Date   HGBA1C 8.2 (A) 07/27/2024    - Hemoglobin A1c goal <6.5%   Discussed that optimal control of type 1 diabetes mellitus with a goal of A1c below 6.5%.  Discussed of type 1 diabetes mellitus and potential chronic complications including diabetic retinopathy, neuropathy and nephropathy.  He has mostly postprandial hyperglycemia due to not giving meal boluses, encouraged and advised to keep meal boluses for the meals.  Change carb ratio as follows to build confidence for him to give meal boluses to avoid hypoglycemia if he boluses and also there is auto correctional boluses.  - Medications:  Insulin  pump setting changed as follows:  Tandem t:slim insulin  pump control IQ using NovoLog  U100.  Insulin  Pump setting:  Basal MN- 1.450u/hour 6AM- 1.40  12PM- 1.50 10PM- 1.350  Bolus CHO Ratio (1unit:CHO) MN- 1:10 6AM- 1:8  12PM- 1:6, changed to 1:8 10PM- 1:6, changed to  1:8  Correction/Sensitivity: MN- 1:50 6AM- 1:45 12PM- 1:35 10PM- 1:40  Target: 110   Active insulin  time: 5 hours   - Home glucose testing: continue CGM /Dexcom G7 and check blood glucose as needed.  Sent new prescription.  Also provided 1 sensor sample in the clinic today.  Supplies and update the provider he has been and also with our clinic, for future supplies. - Discussed/ Gave Hypoglycemia treatment plan.  # Consult : not required at this time.   # Annual urine for microalbuminuria/ creatinine ratio, no microalbuminuria currently, will check today along with BMP with eGFR.   Last  Lab Results  Component Value Date   MICRALBCREAT NOTE 05/08/2022    # Foot check nightly.  # Annual dilated diabetic eye exams.  He is due for it, advised to have diabetic eye exam.  - Diet: Make healthy diabetic food choices - Life style / activity / exercise:.  2. Blood pressure  -  BP Readings from Last 1 Encounters:  07/27/24 118/70    - Control is in target.  - No change in current plans.  3. Lipid status / Hyperlipidemia - Last  Lab Results  Component Value Date   LDLCALC 49 08/18/2023   - No indication of a statin at this time.  Corion was seen today for new patient (initial visit).  Diagnoses and all orders for this visit:  Type 1 diabetes mellitus with complications (HCC) -     POCT glycosylated hemoglobin (Hb A1C) -     Continuous Glucose Sensor (DEXCOM G7 SENSOR) MISC; 1 Device by Does not apply route continuous. -     Basic metabolic panel with GFR -     Microalbumin / creatinine urine ratio  Uncontrolled type 1 diabetes mellitus with hyperglycemia (HCC)    DISPOSITION Follow up in clinic in 3  months suggested.   All questions answered and patient verbalized understanding of the plan.  Matilyn Fehrman, MD [facility] Endocrinology [facility] [address] [place], [address] [place], [place] [address] Phone # [phone]  At least part of this note  was generated using voice recognition software. Inadvertent word errors may have occurred, which were not recognized during the proofreading process.

## 2024-07-28 LAB — MICROALBUMIN / CREATININE URINE RATIO
Creatinine, Urine: 301 mg/dL (ref 20–320)
Microalb Creat Ratio: 11 mg/g{creat} (ref <30)
Microalb, Ur: 3.3 mg/dL

## 2024-07-28 LAB — BASIC METABOLIC PANEL WITH GFR
BUN: 13 mg/dL (ref 7–20)
CO2: 28 mmol/L (ref 20–32)
Calcium: 9.5 mg/dL (ref 8.9–10.4)
Chloride: 105 mmol/L (ref 98–110)
Creat: 1.13 mg/dL (ref 0.60–1.24)
Glucose, Bld: 70 mg/dL (ref 65–99)
Potassium: 3.8 mmol/L (ref 3.8–5.1)
Sodium: 143 mmol/L (ref 135–146)
eGFR: 96 mL/min/1.73m2 (ref >=60)

## 2024-09-24 ENCOUNTER — Telehealth: Payer: Self-pay | Admitting: Endocrinology

## 2024-09-24 NOTE — Telephone Encounter (Signed)
 Patient's mother, Corean Kung, called to say that per [facility] [address] [place], [place], [place] [address]  Phone: [phone] a prior authorization is needed on:  1)  Dexcom G7 Sensor Continuous Glucose Sensor (DEXCOM G7 SENSOR) MISC  2)  Dexcom G6 Transmitter Continuous Glucose Transmitter (DEXCOM G6 TRANSMITTER) MISC  3)  NovoLOG  NOVOLOG  100 UNIT/ML injection  Patient mother states that the above [facility] is the requested pharmacy now.

## 2024-09-30 ENCOUNTER — Telehealth: Payer: Self-pay

## 2024-09-30 ENCOUNTER — Other Ambulatory Visit (HOSPITAL_COMMUNITY): Payer: Self-pay

## 2024-09-30 NOTE — Telephone Encounter (Signed)
 Pharmacy Patient Advocate Encounter   Received notification from Pt Calls Messages that prior authorization for Novolog  is required/requested.   Insurance verification completed.   The patient is insured through [facility].   Per test claim:  generic Novolog  (insulin  aspart) is preferred by the insurance.  If suggested medication is appropriate, Please send in a new RX and discontinue this one. If not, please advise as to why it's not appropriate so that we may request a Prior Authorization. Please note, some preferred medications may still require a PA.  If the suggested medications have not been trialed and there are no contraindications to their use, the PA will not be submitted, as it will not be approved. Archived Key: n/a

## 2024-09-30 NOTE — Telephone Encounter (Signed)
 Pharmacy Patient Advocate Encounter   Received notification from Pt Calls Messages that prior authorization for Dexcom G7 sensor is required/requested.   Insurance verification completed.   The patient is insured through [facility].   Per test claim: PA required; PA submitted to above mentioned insurance via Latent Key/confirmation #/EOC [id number] Status is pending

## 2024-10-01 ENCOUNTER — Other Ambulatory Visit: Payer: Self-pay

## 2024-10-01 DIAGNOSIS — E108 Type 1 diabetes mellitus with unspecified complications: Secondary | ICD-10-CM

## 2024-10-01 MED ORDER — INSULIN ASPART 100 UNIT/ML IJ SOLN: INTRAMUSCULAR | 99 refills | Status: AC

## 2024-10-27 ENCOUNTER — Ambulatory Visit: Admitting: Endocrinology
